# Patient Record
Sex: Female | Born: 1945 | Race: White | Hispanic: No | Marital: Married | Smoking: Current every day smoker
Health system: Southern US, Community
[De-identification: ages and names within clinical notes are randomized; demographics above are authoritative.]

## PROBLEM LIST (undated history)

## (undated) DIAGNOSIS — E785 Hyperlipidemia, unspecified: Secondary | ICD-10-CM

## (undated) DIAGNOSIS — M1990 Unspecified osteoarthritis, unspecified site: Secondary | ICD-10-CM

## (undated) HISTORY — PX: APPENDECTOMY: SHX54

## (undated) HISTORY — PX: ABDOMINAL AORTIC ANEURYSM REPAIR: SUR1152

---

## 2009-06-12 ENCOUNTER — Encounter: Admission: RE | Admit: 2009-06-12 | Discharge: 2009-06-12 | Payer: Self-pay | Admitting: Orthopedic Surgery

## 2010-03-29 ENCOUNTER — Encounter: Payer: Self-pay | Admitting: Orthopedic Surgery

## 2012-03-03 ENCOUNTER — Emergency Department
Admission: EM | Admit: 2012-03-03 | Discharge: 2012-03-03 | Disposition: A | Discharge status: Home / Self Care | Payer: Self-pay | Source: Home / Self Care | Attending: Family Medicine | Admitting: Family Medicine

## 2012-03-03 ENCOUNTER — Encounter: Payer: Self-pay | Admitting: *Deleted

## 2012-03-03 DIAGNOSIS — J069 Acute upper respiratory infection, unspecified: Secondary | ICD-10-CM

## 2012-03-03 DIAGNOSIS — J029 Acute pharyngitis, unspecified: Secondary | ICD-10-CM

## 2012-03-03 HISTORY — DX: Unspecified osteoarthritis, unspecified site: M19.90

## 2012-03-03 HISTORY — DX: Hyperlipidemia, unspecified: E78.5

## 2012-03-03 LAB — POCT RAPID STREP A (OFFICE): Rapid Strep A Screen: NEGATIVE

## 2012-03-03 MED ORDER — BENZONATATE 200 MG PO CAPS: 200.0000 mg | ORAL_CAPSULE | Freq: Every day | ORAL | Status: AC

## 2012-03-03 MED ORDER — AZITHROMYCIN 250 MG PO TABS: ORAL_TABLET | ORAL | Status: AC

## 2012-03-03 NOTE — ED Notes (Signed)
Pt c/o sore throat and ear pain started last weekend. Has tried lozenges.

## 2012-03-03 NOTE — ED Provider Notes (Signed)
History     CSN: [id number]  Arrival date & time 03/03/12  6213   First MD Initiated Contact with Patient 03/03/12 1210      Chief Complaint  Patient presents with  . Sore Throat     HPI Comments: Patient complains of approximately 5 day history of gradually progressive URI symptoms beginning with a mild sore throat (now improved), followed by progressive nasal congestion.  An occasional cough has just started.  Complains of fatigue but no myalgias.  There has been no pleuritic pain, shortness of breath, or wheezes.   She is a smoker.  The history is provided by the patient.    Past Medical History  Diagnosis Date  . Arthritis   . Hyperlipemia     Past Surgical History  Procedure Date  . Appendectomy     Family History  Problem Relation Age of Onset  . Pulmonary fibrosis Mother   . Heart attack Father   . Cancer Father   . Heart attack Sister     History  Substance Use Topics  . Smoking status: Current Every Day Smoker  . Smokeless tobacco: Not on file  . Alcohol Use: Yes    OB History    Grav Para Term Preterm Abortions TAB SAB Ect Mult Living                  Review of Systems + sore throat + occasional cough No pleuritic pain No wheezing + nasal congestion + post-nasal drainage No sinus pain/pressure No itchy/red eyes + right earache No hemoptysis No SOB No fever/chills No nausea No vomiting No abdominal pain No diarrhea No urinary symptoms No skin rashes + fatigue No myalgias No headache   Allergies  Review of patient's allergies indicates no known allergies.  Home Medications   Current Outpatient Rx  Name  Route  Sig  Dispense  Refill  . AZITHROMYCIN 250 MG PO TABS      Take 2 tabs today; then begin one tab once daily for 4 more days. (Rx void after 03/11/12)   6 each   0   . BENZONATATE 200 MG PO CAPS   Oral   Take 1 capsule (200 mg total) by mouth at bedtime. Take as needed for cough   12 capsule   0     BP 125/84   Pulse 72  Temp 97.8 F (36.6 C) (Oral)  Resp 16  Ht 5\' 7"  (1.702 m)  Wt 217 lb 4 oz (98.544 kg)  BMI 34.03 kg/m2  SpO2 98%  Physical Exam Nursing notes and Vital Signs reviewed. Appearance:  Patient appears stated age, and in no acute distress.  Patient is obese (BMI 34) Eyes:  Pupils are equal, round, and reactive to light and accomodation.  Extraocular movement is intact.  Conjunctivae are not inflamed  Ears:  Canals normal.  Tympanic membranes normal.  Nose:  Mildly congested turbinates.  No sinus tenderness.   Pharynx:  Normal Neck:  Supple.  Slightly tender shotty posterior nodes are palpated bilaterally  Lungs:  Clear to auscultation.  Breath sounds are equal.  Heart:  Regular rate and rhythm without murmurs, rubs, or gallops.  Abdomen:  Nontender without masses or hepatosplenomegaly.  Bowel sounds are present.  No CVA or flank tenderness.  Extremities:  No edema.  No calf tenderness Skin:  No rash present.     ED Course  Procedures  none   Labs Reviewed  POCT RAPID STREP A (OFFICE) negative  STREP  A DNA PROBE pending      1. Acute pharyngitis   2. Acute upper respiratory infections of unspecified site       MDM  There is no evidence of bacterial infection today.   Throat culture pending.   Prescription written for Benzonatate [facility]) to take at bedtime for night-time cough.  Take Mucinex D (guaifenesin with decongestant) twice daily for congestion.  Increase fluid intake, rest. May use Afrin nasal spray (or generic oxymetazoline) twice daily for about 5 days.  Also recommend using saline nasal spray several times daily and saline nasal irrigation (AYR is a common brand) Stop all antihistamines for now, and other non-prescription cough/cold preparations. Begin Azithromycin if throat culture positive, if not improving about 5 days or if persistent fever develops. (Given a prescription to hold, with an expiration date)  Follow-up with family doctor if not  improving 7 to 10 days.         Lattie Haw, MD 03/06/12 [phone]

## 2012-03-04 LAB — STREP A DNA PROBE: GASP: NEGATIVE

## 2012-03-06 ENCOUNTER — Telehealth: Payer: Self-pay | Admitting: Emergency Medicine

## 2016-12-06 ENCOUNTER — Emergency Department
Admission: EM | Admit: 2016-12-06 | Discharge: 2016-12-06 | Disposition: A | Discharge status: Home / Self Care | Payer: Medicare Other | Attending: Student in an Organized Health Care Education/Training Program | Admitting: Student in an Organized Health Care Education/Training Program

## 2016-12-06 ENCOUNTER — Emergency Department: Payer: Medicare Other

## 2016-12-06 ENCOUNTER — Encounter: Payer: Self-pay | Admitting: Emergency Medicine

## 2016-12-06 DIAGNOSIS — K5732 Diverticulitis of large intestine without perforation or abscess without bleeding: Secondary | ICD-10-CM | POA: Diagnosis not present

## 2016-12-06 DIAGNOSIS — E860 Dehydration: Secondary | ICD-10-CM | POA: Insufficient documentation

## 2016-12-06 DIAGNOSIS — Z79899 Other long term (current) drug therapy: Secondary | ICD-10-CM | POA: Insufficient documentation

## 2016-12-06 DIAGNOSIS — R55 Syncope and collapse: Secondary | ICD-10-CM

## 2016-12-06 DIAGNOSIS — I10 Essential (primary) hypertension: Secondary | ICD-10-CM | POA: Insufficient documentation

## 2016-12-06 DIAGNOSIS — J449 Chronic obstructive pulmonary disease, unspecified: Secondary | ICD-10-CM | POA: Insufficient documentation

## 2016-12-06 DIAGNOSIS — F17200 Nicotine dependence, unspecified, uncomplicated: Secondary | ICD-10-CM | POA: Insufficient documentation

## 2016-12-06 DIAGNOSIS — R0602 Shortness of breath: Secondary | ICD-10-CM | POA: Diagnosis not present

## 2016-12-06 LAB — CBC
HEMATOCRIT: 37.3 % (ref 35.0–47.0)
Hemoglobin: 12.5 g/dL (ref 12.0–16.0)
MCH: 32.1 pg (ref 26.0–34.0)
MCHC: 33.4 g/dL (ref 32.0–36.0)
MCV: 96 fL (ref 80.0–100.0)
Platelets: 159 10*3/uL (ref 150–440)
RBC: 3.88 MIL/uL (ref 3.80–5.20)
RDW: 14.9 % — AB (ref 11.5–14.5)
WBC: 10.6 10*3/uL (ref 3.6–11.0)

## 2016-12-06 LAB — URINALYSIS, COMPLETE (UACMP) WITH MICROSCOPIC
BACTERIA UA: NONE SEEN
Bilirubin Urine: NEGATIVE
Glucose, UA: NEGATIVE mg/dL
Hgb urine dipstick: NEGATIVE
KETONES UR: NEGATIVE mg/dL
NITRITE: NEGATIVE
PROTEIN: NEGATIVE mg/dL
Specific Gravity, Urine: 1.027 (ref 1.005–1.030)
pH: 5 (ref 5.0–8.0)

## 2016-12-06 LAB — COMPREHENSIVE METABOLIC PANEL
ALBUMIN: 3.8 g/dL (ref 3.5–5.0)
ALT: 38 U/L (ref 14–54)
AST: 36 U/L (ref 15–41)
Alkaline Phosphatase: 77 U/L (ref 38–126)
Anion gap: 11 (ref 5–15)
BUN: 26 mg/dL — AB (ref 6–20)
CHLORIDE: 105 mmol/L (ref 101–111)
CO2: 24 mmol/L (ref 22–32)
CREATININE: 1.61 mg/dL — AB (ref 0.44–1.00)
Calcium: 9.8 mg/dL (ref 8.9–10.3)
GFR calc Af Amer: 36 mL/min — ABNORMAL LOW (ref >60)
GFR calc non Af Amer: 31 mL/min — ABNORMAL LOW (ref >60)
Glucose, Bld: 178 mg/dL — ABNORMAL HIGH (ref 65–99)
POTASSIUM: 3.8 mmol/L (ref 3.5–5.1)
SODIUM: 140 mmol/L (ref 135–145)
Total Bilirubin: 1.7 mg/dL — ABNORMAL HIGH (ref 0.3–1.2)
Total Protein: 6.7 g/dL (ref 6.5–8.1)

## 2016-12-06 LAB — TYPE AND SCREEN
ABO/RH(D): O POS
Antibody Screen: NEGATIVE

## 2016-12-06 LAB — LACTIC ACID, PLASMA: LACTIC ACID, VENOUS: 1.8 mmol/L (ref 0.5–1.9)

## 2016-12-06 LAB — TROPONIN I

## 2016-12-06 MED ORDER — SODIUM CHLORIDE 0.9 % IV BOLUS (SEPSIS)
500.0000 mL | Freq: Once | INTRAVENOUS | Status: AC
  Administered 2016-12-06: 500 mL via INTRAVENOUS

## 2016-12-06 MED ORDER — IPRATROPIUM-ALBUTEROL 0.5-2.5 (3) MG/3ML IN SOLN
3.0000 mL | Freq: Once | RESPIRATORY_TRACT | Status: AC
  Administered 2016-12-06: 3 mL via RESPIRATORY_TRACT
  Filled 2016-12-06: qty 3

## 2016-12-06 MED ORDER — IOPAMIDOL (ISOVUE-370) INJECTION 76%
75.0000 mL | Freq: Once | INTRAVENOUS | Status: AC | PRN
  Administered 2016-12-06: 75 mL via INTRAVENOUS

## 2016-12-06 MED ORDER — SODIUM CHLORIDE 0.9 % IV BOLUS (SEPSIS)
1000.0000 mL | Freq: Once | INTRAVENOUS | Status: AC
  Administered 2016-12-06: 1000 mL via INTRAVENOUS

## 2016-12-06 NOTE — ED Triage Notes (Signed)
Pt to ED via EMS from home , was playing cards with friends and has near syncopal episode, pt states she became lightheaded and unaware if any LOC. Pt appears very fatigue. Pt recently had AAA repair last month. EMS BP on scene was 80/54, current BP is 93/59. Pt A&OX4

## 2016-12-06 NOTE — ED Notes (Signed)
Pt verbalizes d/c teaching and follow up. Pt in NAD at time of d/c, VS stbale. Pt placed in wc to lobby

## 2016-12-06 NOTE — ED Provider Notes (Signed)
[facility] Emergency Department Provider Note    First MD Initiated Contact with Patient 12/06/16 1301     (approximate)  I have reviewed the triage vital signs and the nursing notes.   HISTORY  Chief Complaint Near Syncope    HPI Jacqueline Bates is a 71 y.o. female presents with chief complaint of near syncopal episode that occurred while she was playing bridge today. Patient states that she's had decreased oral intake particularly since yesterday. States that she's been*Lyle constipation since she had endovascular AAA repair done in [place] last month. Denies any chest pain. Does have chronic shortness of breath secondary to emphysema and COPD. States that she normally has high blood pressure and took her blood pressure medications this morning. Did not have much to eat this morning. Denies any fevers. No nausea or vomiting.   Past Medical History:  Diagnosis Date  . Arthritis   . Hyperlipemia    Family History  Problem Relation Age of Onset  . Pulmonary fibrosis Mother   . Heart attack Father   . Cancer Father   . Heart attack Sister    Past Surgical History:  Procedure Laterality Date  . ABDOMINAL AORTIC ANEURYSM REPAIR    . APPENDECTOMY     There are no active problems to display for this patient.     Prior to Admission medications   Medication Sig Start Date End Date Taking? Authorizing Provider  azithromycin (ZITHROMAX Z-PAK) 250 MG tablet Take 2 tabs today; then begin one tab once daily for 4 more days. (Rx void after 03/11/12) 03/03/12   Lattie Haw, MD  benzonatate (TESSALON) 200 MG capsule Take 1 capsule (200 mg total) by mouth at bedtime. Take as needed for cough 03/03/12   Lattie Haw, MD    Allergies Patient has no known allergies.    Social History Social History  Substance Use Topics  . Smoking status: Current Every Day Smoker  . Smokeless tobacco: Never Used  . Alcohol use Yes    Review of  Systems Patient denies headaches, rhinorrhea, blurry vision, numbness, shortness of breath, chest pain, edema, cough, abdominal pain, nausea, vomiting, diarrhea, dysuria, fevers, rashes or hallucinations unless otherwise stated above in HPI. ____________________________________________   PHYSICAL EXAM:  VITAL SIGNS: Vitals:   12/06/16 1425 12/06/16 1515  BP: (!) 107/93 (!) 141/69  Pulse:  63  Resp: (!) 29 16  Temp:    SpO2:  100%    Constitutional: Alert ill appearing, fatigued appearing but protecting her airway. Eyes: Conjunctivae are normal.  Head: Atraumatic. Nose: No congestion/rhinnorhea. Mouth/Throat: Mucous membranes are moist.   Neck: No stridor. Painless ROM.  Cardiovascular: Normal rate, regular rhythm. Grossly normal heart sounds.  Good peripheral circulation. Respiratory: Normal respiratory effort.  No retractions. Lungs with coarse bibasilar breathsounds Gastrointestinal: Soft and nontender. No distention. No abdominal bruits. No CVA tenderness. Genitourinary:  Musculoskeletal: No lower extremity tenderness nor edema.  No joint effusions. Neurologic:  Normal speech and language. No gross focal neurologic deficits are appreciated. No facial droop Skin:  Skin is warm, dry and intact. No rash noted. Psychiatric: Mood and affect are normal. Speech and behavior are normal.  ____________________________________________   LABS (all labs ordered are listed, but only abnormal results are displayed)  Results for orders placed or performed during the hospital encounter of 12/06/16 (from the past 24 hour(s))  Urinalysis, Complete w Microscopic     Status: Abnormal   Collection Time: 12/06/16  1:04 PM  Result Value Ref Range   Color, Urine YELLOW (A) YELLOW   APPearance CLEAR (A) CLEAR   Specific Gravity, Urine 1.027 1.005 - 1.030   pH 5.0 5.0 - 8.0   Glucose, UA NEGATIVE NEGATIVE mg/dL   Hgb urine dipstick NEGATIVE NEGATIVE   Bilirubin Urine NEGATIVE NEGATIVE    Ketones, ur NEGATIVE NEGATIVE mg/dL   Protein, ur NEGATIVE NEGATIVE mg/dL   Nitrite NEGATIVE NEGATIVE   Leukocytes, UA TRACE (A) NEGATIVE   RBC / HPF 0-5 0 - 5 RBC/hpf   WBC, UA 6-30 0 - 5 WBC/hpf   Bacteria, UA NONE SEEN NONE SEEN   Squamous Epithelial / LPF 0-5 (A) NONE SEEN   Mucus PRESENT    Hyaline Casts, UA PRESENT   CBC     Status: Abnormal   Collection Time: 12/06/16  1:14 PM  Result Value Ref Range   WBC 10.6 3.6 - 11.0 K/uL   RBC 3.88 3.80 - 5.20 MIL/uL   Hemoglobin 12.5 12.0 - 16.0 g/dL   HCT 16.1 09.6 - 04.5 %   MCV 96.0 80.0 - 100.0 fL   MCH 32.1 26.0 - 34.0 pg   MCHC 33.4 32.0 - 36.0 g/dL   RDW 40.9 (H) 81.1 - 91.4 %   Platelets 159 150 - 440 K/uL  Comprehensive metabolic panel     Status: Abnormal   Collection Time: 12/06/16  1:14 PM  Result Value Ref Range   Sodium 140 135 - 145 mmol/L   Potassium 3.8 3.5 - 5.1 mmol/L   Chloride 105 101 - 111 mmol/L   CO2 24 22 - 32 mmol/L   Glucose, Bld 178 (H) 65 - 99 mg/dL   BUN 26 (H) 6 - 20 mg/dL   Creatinine, Ser 7.82 (H) 0.44 - 1.00 mg/dL   Calcium 9.8 8.9 - 95.6 mg/dL   Total Protein 6.7 6.5 - 8.1 g/dL   Albumin 3.8 3.5 - 5.0 g/dL   AST 36 15 - 41 U/L   ALT 38 14 - 54 U/L   Alkaline Phosphatase 77 38 - 126 U/L   Total Bilirubin 1.7 (H) 0.3 - 1.2 mg/dL   GFR calc non Af Amer 31 (L) >60 mL/min   GFR calc Af Amer 36 (L) >60 mL/min   Anion gap 11 5 - 15  Troponin I     Status: None   Collection Time: 12/06/16  1:14 PM  Result Value Ref Range   Troponin I <0.03 <0.03 ng/mL  Type and screen [facility]     Status: None (Preliminary result)   Collection Time: 12/06/16  1:14 PM  Result Value Ref Range   ABO/RH(D) PENDING    Antibody Screen PENDING    Sample Expiration 12/09/2016   Lactic acid, plasma     Status: None   Collection Time: 12/06/16  1:14 PM  Result Value Ref Range   Lactic Acid, Venous 1.8 0.5 - 1.9 mmol/L  Type and screen     Status: None   Collection Time: 12/06/16  1:45 PM   Result Value Ref Range   ABO/RH(D) O POS    Antibody Screen NEG    Sample Expiration 12/09/2016    ____________________________________________  EKG My review and personal interpretation at Time: 13:13   Indication: near syncope  Rate: 60  Rhythm: sinus Axis: normal Other: nromal intervals, no stemi, no depressions ____________________________________________  RADIOLOGY  I personally reviewed all radiographic images ordered to evaluate for the above acute complaints and reviewed radiology  reports and findings.  These findings were personally discussed with the patient.  Please see medical record for radiology report.  ____________________________________________   PROCEDURES  Procedure(s) performed:  Procedures    Critical Care performed: no ____________________________________________   INITIAL IMPRESSION / ASSESSMENT AND PLAN / ED COURSE  Pertinent labs & imaging results that were available during my care of the patient were reviewed by me and considered in my medical decision making (see chart for details).  DDX: dehydration, sepsis, hemorrhagic shock, uti, copd, dysrhythmia  Julee Stoll is a 71 y.o. who presents to the ED with hypotension and near syncopal event. Patient is fatigued appearing but nontoxic and protecting her airway. Presentation is Located by previous endovascular AAA repair. No hematochezia or melena. Possible dehydration given poor oral intake. We'll provide IV fluids. Will order CT angiogram to evaluate for any evidence of endoleak.  The patient will be placed on continuous pulse oximetry and telemetry for monitoring.  Laboratory evaluation will be sent to evaluate for the above complaints.     Clinical Course as of Dec 06 1636  [facility] Dec 06, 2016  1501 DG Chest [place] 1 View [PR]  1521 BP is improving. CT angiogram shows no acute abnormality.  Appears appropriate status post endovascular repair of AAA.  [PR]  1635 Patient reassessed and feeling  much improved after IV fluids. Her blood pressure has resolved. Denies that that there is some component of dehydration secondary to constipation followed by a large volume diarrhea this morning as well as taken her blood pressure medication but her symptoms have completely resolved here. I do believe that she is stable for follow-up with her PCP on Wednesday.  Have discussed with the patient and available family all diagnostics and treatments performed thus far and all questions were answered to the best of my ability. The patient demonstrates understanding and agreement with plan.   [PR]    Clinical Course User Index [PR] Willy Eddy, MD   patient states that she has follow-up with her PCP on Wednesday and is essentially gone to see her vascular surgeon tomorrow for site. Patient encouraged to speak with her physicians regarding further titration of her antihypertensive medications.  ____________________________________________   FINAL CLINICAL IMPRESSION(S) / ED DIAGNOSES  Final diagnoses:  Dehydration  Near syncope      NEW MEDICATIONS STARTED DURING THIS VISIT:  New Prescriptions   No medications on file     Note:  This document was prepared using Dragon voice recognition software and may include unintentional dictation errors.    Willy Eddy, MD 12/06/16 [phone]

## 2016-12-06 NOTE — ED Notes (Signed)
Patient transported to CT 

## 2018-05-18 IMAGING — CT CT CTA ABD/PEL W/CM AND/OR W/O CM
2 of 10 series · 12 of 46 positions shown, 15 images · IV contrast (APPLIED)
Comparison: None.

CLINICAL DATA: 71-year-old female with a history of abdominal
aortic aneurysm repair approximately 5 weeks ago with an outside
institution. She experienced syncope and hypotension today while
playing cards.

EXAM:
CTA ABDOMEN AND PELVIS wITHOUT AND WITH CONTRAST
TECHNIQUE: Multidetector CT imaging of the abdomen and pelvis was performed
using the standard protocol during bolus administration of
intravenous contrast. Multiplanar reconstructed images and MIPs were
obtained and reviewed to evaluate the vascular anatomy.
CONTRAST:  75 mL Isovue 370

[Series 5: axial arterial · axial · arterial · 0.82mm/px · z∈[-560,-128]mm · 10 of 168 slices shown, 13 images]
[im 12/168  soft-tissue]
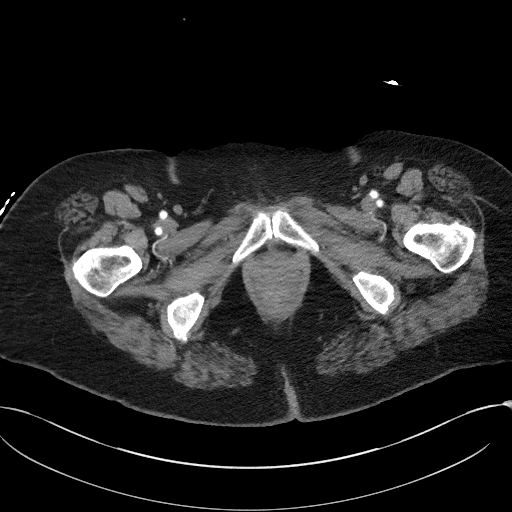
[im 12/168  bone]
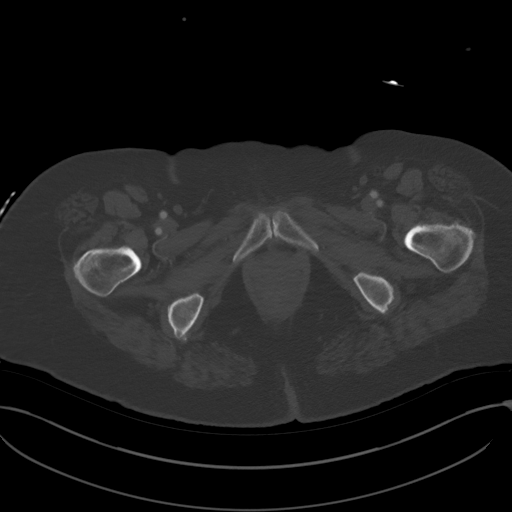
[im 36/168  soft-tissue]
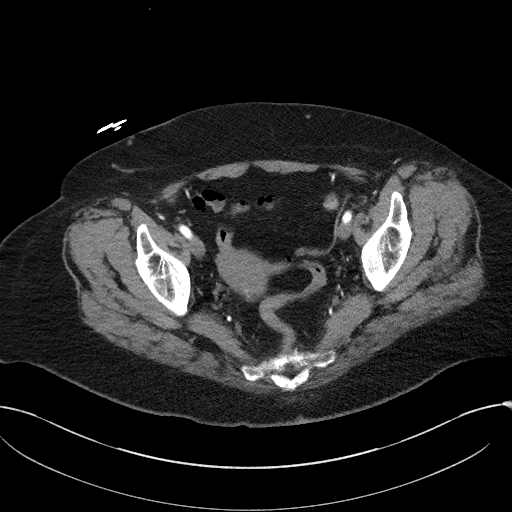
[im 60/168  soft-tissue]
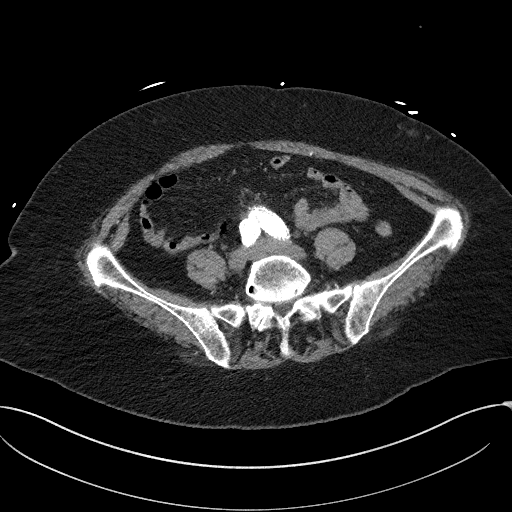
[im 72/168  soft-tissue]
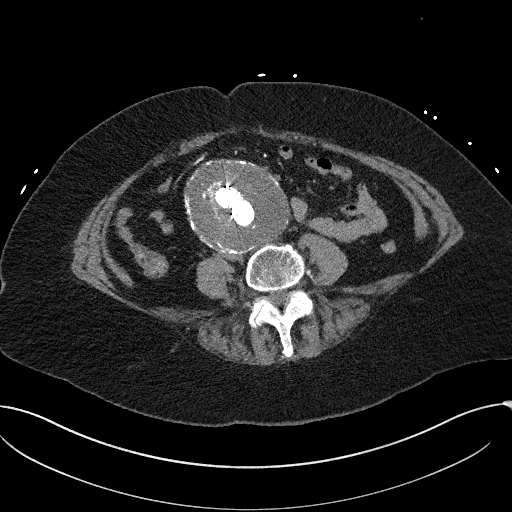
[im 96/168  soft-tissue]
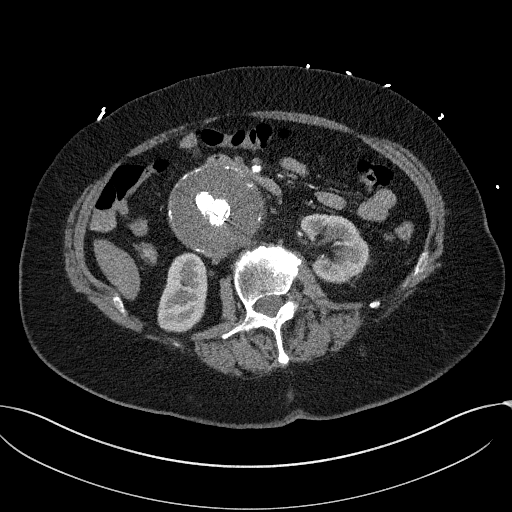
[im 108/168  soft-tissue]
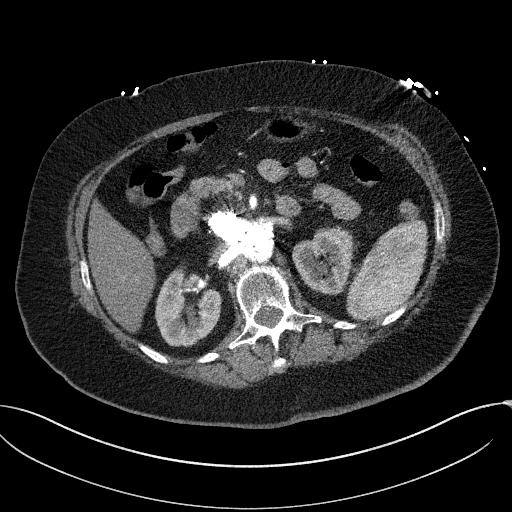
[im 120/168  lung]
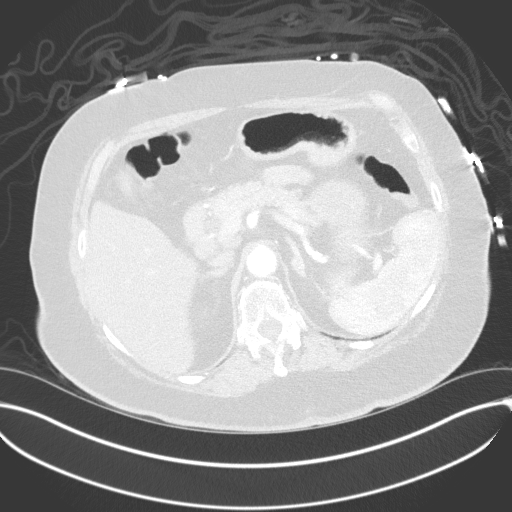
[im 132/168  soft-tissue]
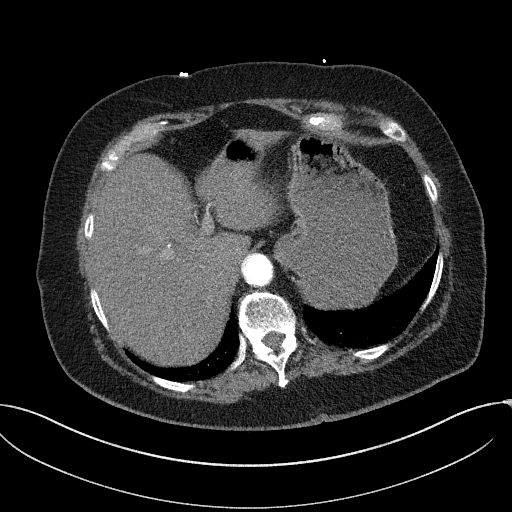
[im 132/168  lung]
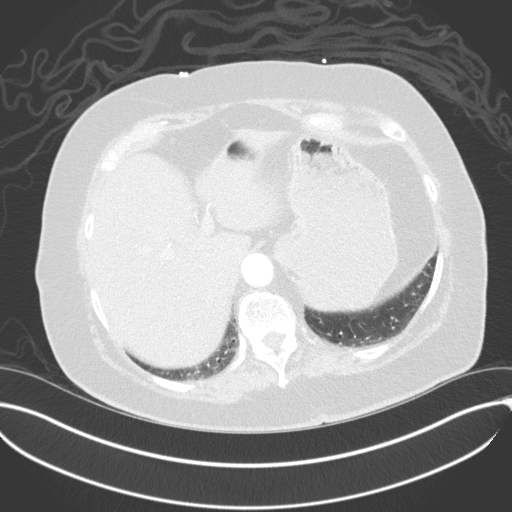
[im 144/168  lung]
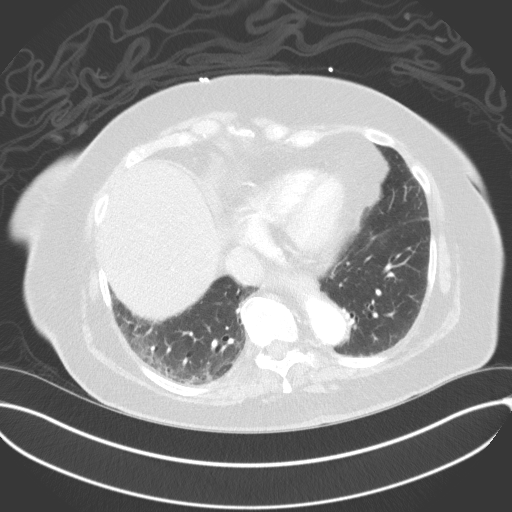
[im 156/168  soft-tissue]
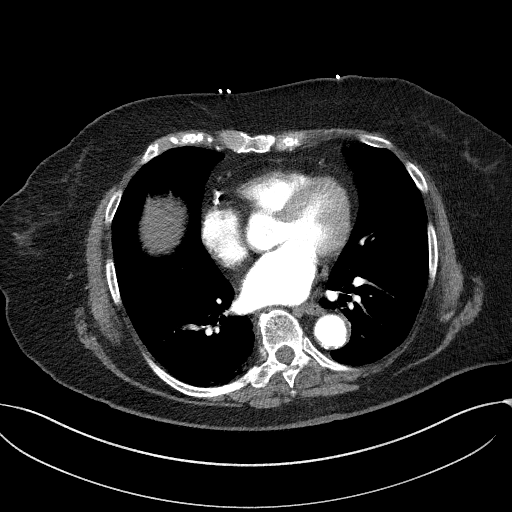
[im 156/168  lung]
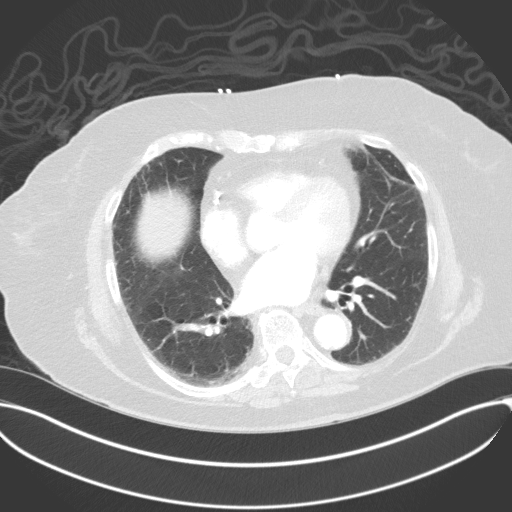

[Series 7: coronals · coronal · 0.85mm/px · 2 of 135 slices shown]
[im 45/135  soft-tissue]
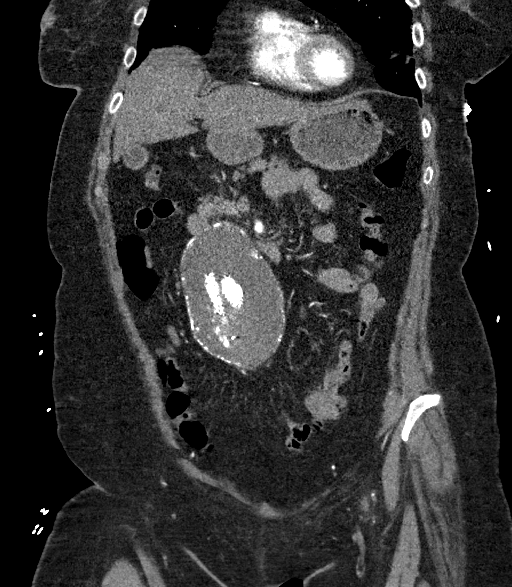
[im 90/135  soft-tissue]
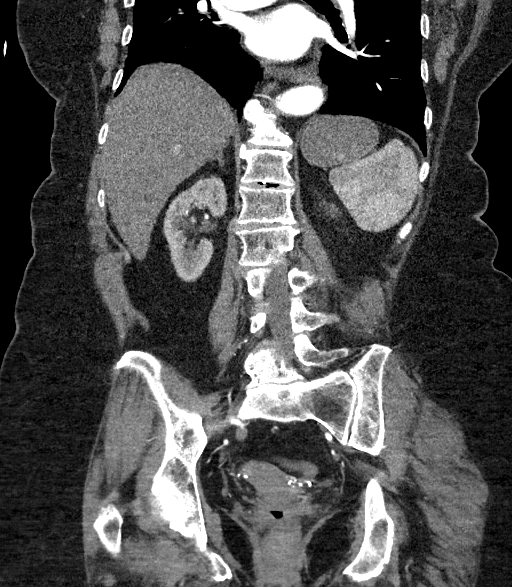

[12 of 46 positions shown; findings below may reference images not displayed]

FINDINGS: VASCULAR

Aorta: Fusiform infrarenal abdominal aortic aneurysm status post
endovascular aortic repair with a bifurcated core endoprosthesis
extending from above both renal artery's and into the common iliac
arteries bilaterally. No definite endoleak. Maximal aortic diameter
is 9.6 x 8.1 cm. Bilateral renal artery snorkels are present.

Celiac: Patent without evidence of aneurysm, dissection, vasculitis
or significant stenosis.

SMA: Patent without evidence of aneurysm, dissection, vasculitis or
significant stenosis. Replaced right hepatic artery.

Renals: Bilateral dominant renal arteries containing patent stent
grafts.

IMA: Occluded at the origin

Inflow: Calcified atherosclerotic plaque results in mild stenosis of
the right internal iliac artery. The right external iliac artery is
widely patent. The left internal and external iliac arteries are
widely patent.

Proximal Outflow: Bilateral common femoral and visualized portions
of the superficial and profunda femoral arteries are patent without
evidence of aneurysm, dissection, vasculitis or significant
stenosis.

Veins: No focal venous abnormality.

Review of the MIP images confirms the above findings.

NON-VASCULAR

Lower chest: The cardiac structures are normal in size.
Atherosclerotic plaque present along the course of the coronary
arteries. No pericardial effusion. Visualized thoracic aorta is
unremarkable. The visualized esophagus is also unremarkable. There
is mild dependent atelectasis bilaterally. Otherwise, the visualized
lower lungs are clear.

Hepatobiliary: Subcentimeter low-attenuation lesions in the
periphery of hepatic segment [DATE] and 4a are too small to
characterize. No additional liver lesions are identified.
Peripherally calcified stone present within the gallbladder neck. No
intra or extrahepatic biliary ductal dilatation.

Pancreas: Unremarkable. No pancreatic ductal dilatation or
surrounding inflammatory changes.

Spleen: Normal in size without focal abnormality.

Adrenals/Urinary Tract: Normal adrenal glands. Both kidneys are
normally perfusing. No enhancing renal mass. 2 cm water attenuation
simple cyst exophytic from the upper pole of the right kidney. No
evidence of hydronephrosis or nephrolithiasis. The ureters and
bladder are unremarkable.

Stomach/Bowel: The stomach is normal in appearance. Small
periampullary duodenum diverticulum. No focal bowel wall thickening
or evidence of obstruction. Colonic diverticular disease without CT
evidence of active inflammation.

Lymphatic: No suspicious lymphadenopathy.

Reproductive: Uterus and bilateral adnexa are unremarkable.

Other: No abdominal wall hernia or abnormality. No abdominopelvic
ascites.

Musculoskeletal: No acute fracture or aggressive appearing lytic or
blastic osseous lesion. Multilevel degenerative disc disease. Very
mild lumbar facet arthropathy.
IMPRESSION: VASCULAR

1. Large (9.6 cm) infrarenal abdominal aortic aneurysm status post
endovascular aortic repair with bilateral renal arteries snorkels.
No evidence of endoleak, rupture or other complicating feature. The
excluded aneurysm sac measures a maximum of 9.6 x 8.1 cm in
diameter. Aortic Atherosclerosis (W7P9L-ZA8.8).; Aortic aneurysm NOS
(W7P9L-DUO.6).
2. Both renal artery covered stents are widely patent. No evidence
of renal infarction or ischemia.
3. Coronary artery calcifications.

NON-VASCULAR

1. Mild dependent lower lobe atelectasis.
2. Nonspecific subcentimeter low-attenuation liver lesions are too
small to characterize. Statistically, these are likely represent
benign cysts unless the patient has a known underlying primary
malignancy.
3. Colonic diverticular disease without CT evidence of active
inflammation.
4. Multilevel degenerative disc disease.
5. Cholelithiasis.
